# Patient Record
Sex: Male | Born: 1995 | Hispanic: Yes | Marital: Single | Smoking: Never smoker
Health system: Southern US, Community
[De-identification: ages and names within clinical notes are randomized; demographics above are authoritative.]

## PROBLEM LIST (undated history)

## (undated) DIAGNOSIS — I10 Essential (primary) hypertension: Secondary | ICD-10-CM

---
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
## 2015-10-19 ENCOUNTER — Other Ambulatory Visit
Admission: RE | Admit: 2015-10-19 | Discharge: 2015-10-19 | Disposition: A | Discharge status: Home / Self Care | Payer: 59 | Source: Ambulatory Visit | Attending: Pediatric Cardiology | Admitting: Pediatric Cardiology

## 2015-10-19 ENCOUNTER — Other Ambulatory Visit: Payer: Self-pay | Admitting: Pediatric Cardiology

## 2015-10-19 DIAGNOSIS — I10 Essential (primary) hypertension: Secondary | ICD-10-CM

## 2015-10-19 LAB — BASIC METABOLIC PANEL
Anion gap: 7 (ref 5–15)
BUN: 12 mg/dL (ref 6–20)
CALCIUM: 9.5 mg/dL (ref 8.9–10.3)
CO2: 26 mmol/L (ref 22–32)
Chloride: 103 mmol/L (ref 101–111)
Creatinine, Ser: 0.69 mg/dL (ref 0.61–1.24)
Glucose, Bld: 84 mg/dL (ref 65–99)
Potassium: 3.6 mmol/L (ref 3.5–5.1)
Sodium: 136 mmol/L (ref 135–145)

## 2017-02-24 ENCOUNTER — Emergency Department
Admission: EM | Admit: 2017-02-24 | Discharge: 2017-02-24 | Disposition: A | Discharge status: Home / Self Care | Payer: 59 | Attending: Emergency Medicine | Admitting: Emergency Medicine

## 2017-02-24 ENCOUNTER — Encounter: Payer: Self-pay | Admitting: Emergency Medicine

## 2017-02-24 ENCOUNTER — Emergency Department: Payer: 59

## 2017-02-24 DIAGNOSIS — S93402A Sprain of unspecified ligament of left ankle, initial encounter: Secondary | ICD-10-CM

## 2017-02-24 DIAGNOSIS — I10 Essential (primary) hypertension: Secondary | ICD-10-CM | POA: Diagnosis not present

## 2017-02-24 DIAGNOSIS — Y929 Unspecified place or not applicable: Secondary | ICD-10-CM | POA: Insufficient documentation

## 2017-02-24 DIAGNOSIS — W010XXA Fall on same level from slipping, tripping and stumbling without subsequent striking against object, initial encounter: Secondary | ICD-10-CM | POA: Diagnosis not present

## 2017-02-24 DIAGNOSIS — Y999 Unspecified external cause status: Secondary | ICD-10-CM | POA: Insufficient documentation

## 2017-02-24 DIAGNOSIS — Y9302 Activity, running: Secondary | ICD-10-CM | POA: Diagnosis not present

## 2017-02-24 DIAGNOSIS — S93602A Unspecified sprain of left foot, initial encounter: Secondary | ICD-10-CM | POA: Diagnosis not present

## 2017-02-24 DIAGNOSIS — S99922A Unspecified injury of left foot, initial encounter: Secondary | ICD-10-CM | POA: Diagnosis not present

## 2017-02-24 HISTORY — DX: Essential (primary) hypertension: I10

## 2017-02-24 NOTE — ED Triage Notes (Signed)
Reports at club dancing and fell 1 week ago.  Reports pain and swelling to left ankle and foot.  Reports has been working on his feet all week.

## 2017-02-24 NOTE — ED Provider Notes (Signed)
[facility] Emergency Department Provider Note   ____________________________________________   First MD Initiated Contact with Patient 02/24/17 0222     (approximate)  I have reviewed the triage vital signs and the nursing notes.   HISTORY  Chief Complaint Foot Pain    HPI Jamison NeighborJeffrey Lanzo is a 21 y.o. male whocomes into the hospital today with some ankle swelling and pain. The patient states that he sprained his ankle one week ago. He states that he was drunk and her friend was helping him. He was running and he missed a H and landed on his ankle. The patient states that he didn't have any pain. He has worked throughout the week and has been walking on his ankle and foot all day. He's been taking ibuprofen for the pain. His ankle was initially swollen but then his foot became swollen and bruised. The patient states his pain is a 3-4/10 on the pain scale but it's worse when he doesn't take anything. The patient denies any numbness or tingling. He was concerned about a fracture so he decided to come into the hospital today for evaluation.    Past Medical History:  Diagnosis Date  . Hypertension     There are no active problems to display for this patient.   History reviewed. No pertinent surgical history.  Prior to Admission medications   Not on File    Allergies Patient has no known allergies.  No family history on file.  Social History Social History  Substance Use Topics  . Smoking status: Never Smoker  . Smokeless tobacco: Not on file  . Alcohol use Yes    Review of Systems  Constitutional: No fever/chills Eyes: No visual changes. ENT: No sore throat. Cardiovascular: Denies chest pain. Respiratory: Denies shortness of breath. Gastrointestinal: No abdominal pain.  No nausea, no vomiting.  No diarrhea.  No constipation. Genitourinary: Negative for dysuria. Musculoskeletal: Left ankle swelling and pain Skin: Negative for  rash. Neurological: Negative for headaches, focal weakness or numbness.   ____________________________________________   PHYSICAL EXAM:  VITAL SIGNS: ED Triage Vitals  Enc Vitals Group     BP 02/24/17 0105 (!) 159/85     Pulse Rate 02/24/17 0105 94     Resp 02/24/17 0105 20     Temp 02/24/17 0105 98.6 F (37 C)     Temp Source 02/24/17 0105 Oral     SpO2 02/24/17 0105 100 %     Weight 02/24/17 0105 153 lb (69.4 kg)     Height 02/24/17 0105 5\' 8"  (1.727 m)     Head Circumference --      Peak Flow --      Pain Score 02/24/17 0104 0     Pain Loc --      Pain Edu? --      Excl. in GC? --     Constitutional: Alert and oriented. Well appearing and in Mild distress. Eyes: Conjunctivae are normal. PERRL. EOMI. Head: Atraumatic. Nose: No congestion/rhinnorhea. Mouth/Throat: Mucous membranes are moist.  Oropharynx non-erythematous. Cardiovascular: Normal rate, regular rhythm. Grossly normal heart sounds.  Good peripheral circulation. Respiratory: Normal respiratory effort.  No retractions. Lungs CTAB. Gastrointestinal: Soft and nontender. No distention. Positive bowel sounds Musculoskeletal:  Left ankle and foot swelling patient has some bruising to the lateral ankle as well as to the medial posterior ankle. Pain with flexion and eversion of the ankle. Neurologic:  Normal speech and language. No gross focal neurologic deficits are appreciated. No gait instability.  Skin:  Skin is warm, dry and intact. Bruising noted around the ankle and around toes. Psychiatric: Mood and affect are normal.   ____________________________________________   LABS (all labs ordered are listed, but only abnormal results are displayed)  Labs Reviewed - No data to display ____________________________________________  EKG  none ____________________________________________  RADIOLOGY  Dg Ankle Complete Left  Result Date: 02/24/2017 CLINICAL DATA:  Initial evaluation for recent injury, twisted  ankle one week ago. EXAM: LEFT ANKLE COMPLETE - 3+ VIEW COMPARISON:  None. FINDINGS: No acute fracture or dislocation. Ankle mortise approximated. Talar dome intact. Diffuse soft tissue swelling present about the ankle. 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 IMPRESSION: 1. No acute osseous abnormality about the left ankle. 2. Diffuse soft tissue swelling about the ankle. 3. 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 Electronically Signed   By: Rise MuBenjamin  McClintock M.D.   On: 02/24/2017 01:36   Dg Foot Complete Left  Result Date: 02/24/2017 CLINICAL DATA:  Initial evaluation for acute pain, recent injury, twisted ankle. EXAM: LEFT FOOT - COMPLETE 3+ VIEW COMPARISON:  None. FINDINGS: No acute fracture dislocation. Joint spaces well maintained without evidence for significant degenerative or erosive arthropathy. Probable NOF noted within the distal left tibia, better evaluated on concomitant radiograph of the left ankle. No other focal osseous lesions. Mild diffuse soft tissue swelling about the foot. IMPRESSION: 1. No acute osseous abnormality about the left foot. 2. Mild diffuse soft tissue swelling about the foot. Electronically Signed   By: Rise MuBenjamin  McClintock M.D.   On: 02/24/2017 01:38    ____________________________________________   PROCEDURES  Procedure(s) performed: None  Procedures  Critical Care performed: No  ____________________________________________   INITIAL IMPRESSION / ASSESSMENT AND PLAN / ED COURSE  Pertinent labs & imaging results that were available during my care of the patient were reviewed by me and considered in my medical decision making (see chart for details).  This is a 21 year old male who comes into the hospital today with some ankle pain and swelling. The patient injured his ankle  about a week ago but he was still having pain and swelling and bruising so he decided to come in tonight for evaluation. The patient had an x-ray which was unremarkable. The patient does have a sclerotic lesion in his distal tibia but no acute fracture. I encourage the patient to follow-up with orthopedic surgery for further evaluation. I informed him that he may have some ligamentous injury causing his bruising in this pain. The patient will be placed in an ankle splint and he will be discharged.      ____________________________________________   FINAL CLINICAL IMPRESSION(S) / ED DIAGNOSES  Final diagnoses:  Sprain of left ankle, unspecified ligament, initial encounter      NEW MEDICATIONS STARTED DURING THIS VISIT:  There are no discharge medications for this patient.    Note:  This document was prepared using Dragon voice recognition software and may include unintentional dictation errors.    Rebecka ApleyWebster, Lonia Roane P, MD 02/24/17 [phone]

## 2017-02-24 NOTE — Discharge Instructions (Signed)
Please follow up with orthopedic surgery. °

## 2017-12-31 DIAGNOSIS — Z0000 Encounter for general adult medical examination without abnormal findings: Secondary | ICD-10-CM | POA: Diagnosis not present

## 2018-01-14 IMAGING — CR DG FOOT COMPLETE 3+V*L*
3 series · 3 of 3 positions shown · non-contrast
Comparison: None.

CLINICAL DATA: Initial evaluation for acute pain, recent injury,
twisted ankle.

EXAM:
LEFT FOOT - COMPLETE 3+ VIEW

[foot ap]
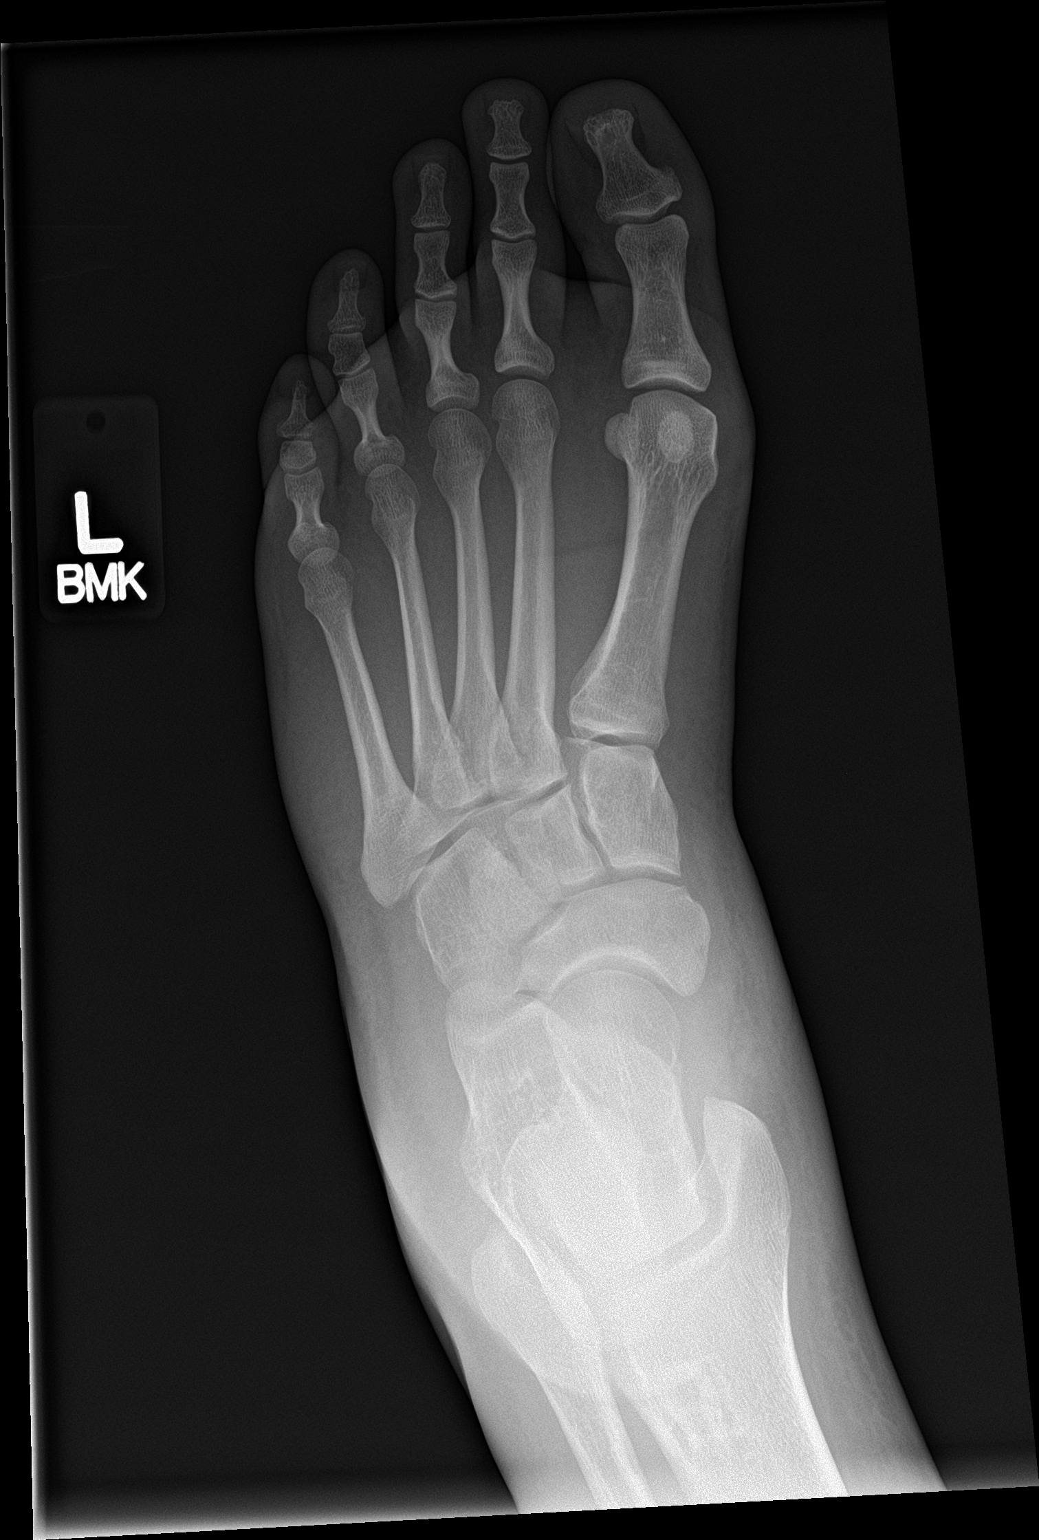

[foot obl]
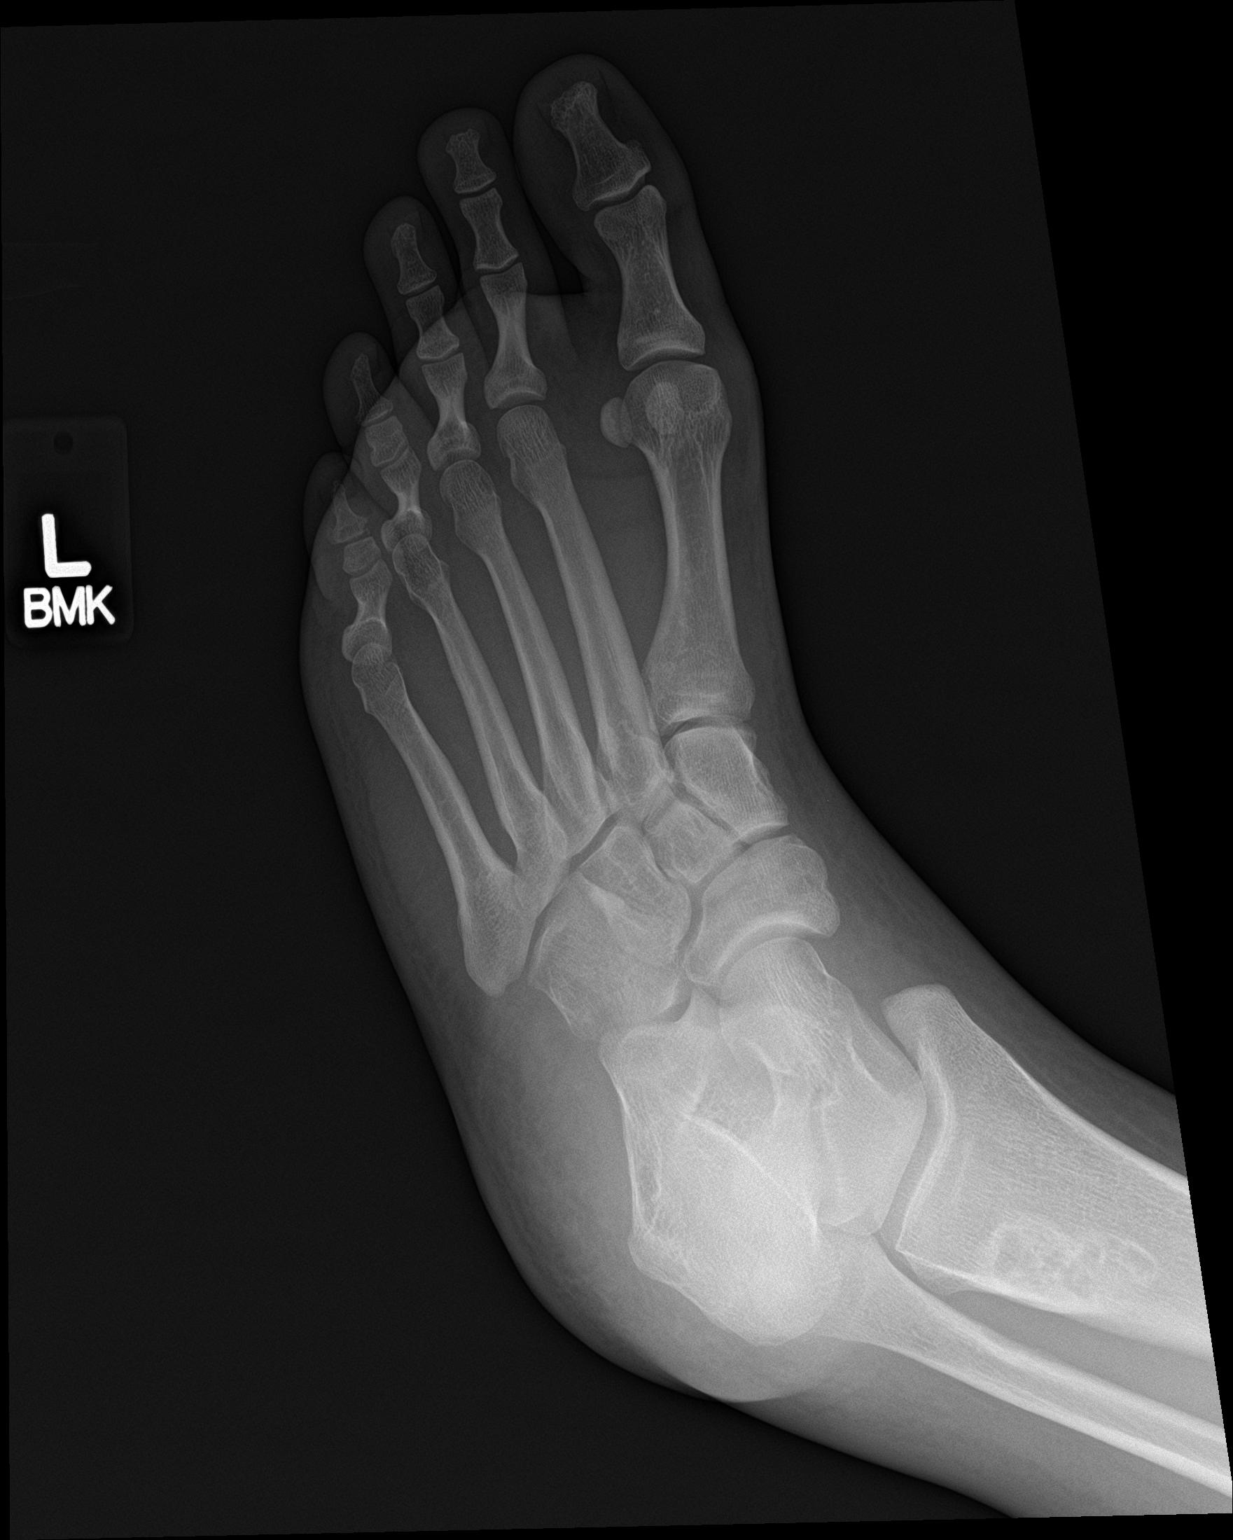

[foot lat]
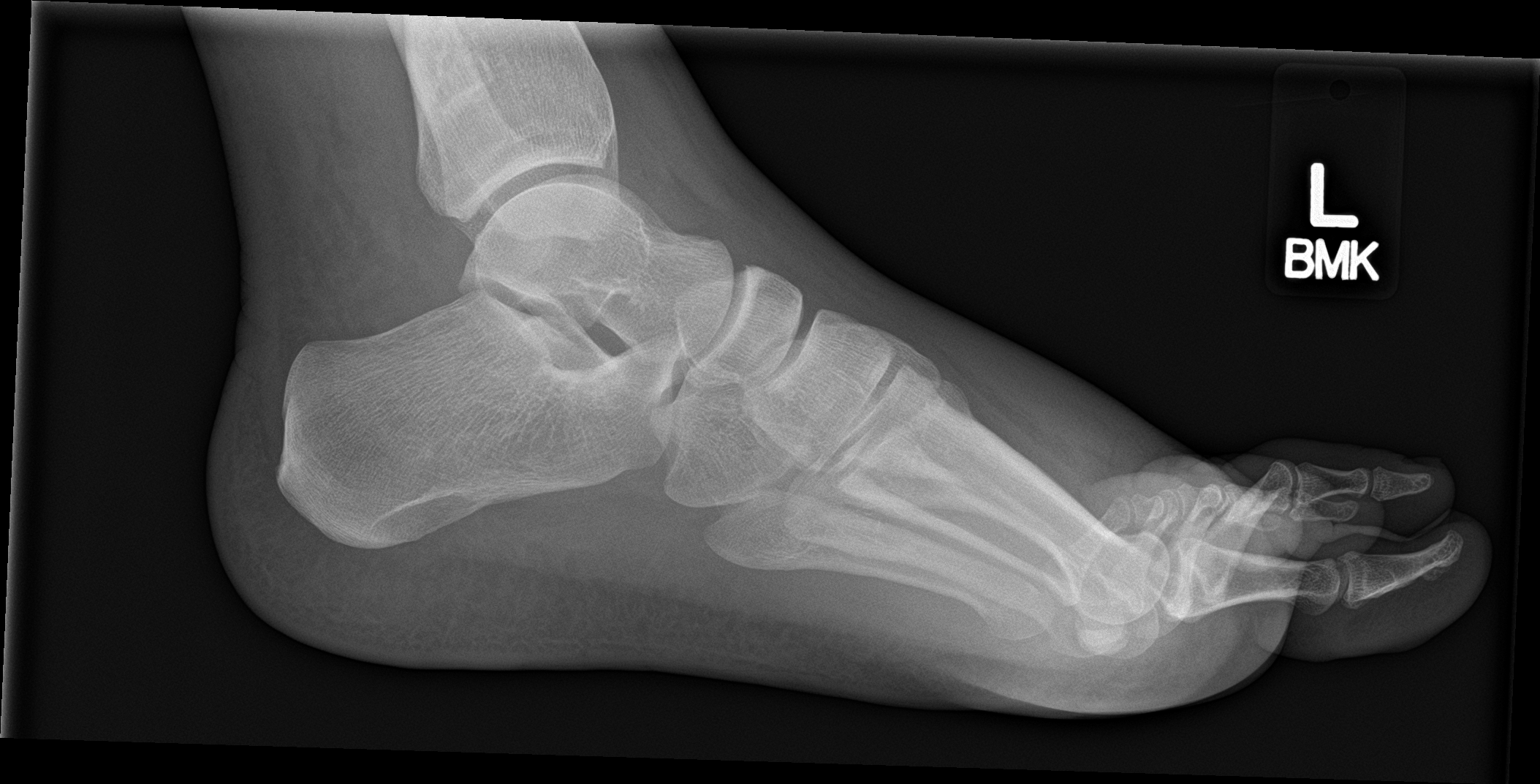

[3 of 3 positions shown; findings below may reference images not displayed]

FINDINGS: No acute fracture dislocation. Joint spaces well maintained without
evidence for significant degenerative or erosive arthropathy.
Probable NOF noted within the distal left tibia, better evaluated on
concomitant radiograph of the left ankle. No other focal osseous
lesions.

Mild diffuse soft tissue swelling about the foot.
IMPRESSION: 1. No acute osseous abnormality about the left foot.
2. Mild diffuse soft tissue swelling about the foot.

## 2020-04-26 ENCOUNTER — Ambulatory Visit: Payer: Self-pay | Attending: Internal Medicine

## 2020-04-26 DIAGNOSIS — Z23 Encounter for immunization: Secondary | ICD-10-CM

## 2020-04-26 NOTE — Progress Notes (Signed)
   Covid-19 Vaccination Clinic  Name:  Jeremiah Fernandez    MRN: [id number] DOB: 24-Jun-1996  04/26/2020  [name] was observed post Covid-19 immunization for 30 minutes based on pre-vaccination screening without incident. He was provided with Vaccine Information Sheet and instruction to access the V-Safe system.   [name] was instructed to call 911 with any severe reactions post vaccine: Marland Kitchen Difficulty breathing  . Swelling of face and throat  . A fast heartbeat  . A bad rash all over body  . Dizziness and weakness   Immunizations Administered    Name Date Dose VIS Date Route   Pfizer COVID-19 Vaccine 04/26/2020  1:40 PM 0.3 mL 09/17/2018 Intramuscular   Manufacturer: [employer], [employer]   Lot: [id number]   NDC: [address]-0

## 2020-05-17 ENCOUNTER — Ambulatory Visit: Payer: Medicaid Other | Attending: Internal Medicine

## 2020-05-17 DIAGNOSIS — Z23 Encounter for immunization: Secondary | ICD-10-CM

## 2020-05-17 NOTE — Progress Notes (Signed)
   Covid-19 Vaccination Clinic  Name:  Jeremiah Fernandez    MRN: [id number] DOB: April 15, 1996  05/17/2020  [name] was observed post Covid-19 immunization for 15 minutes without incident. He was provided with Vaccine Information Sheet and instruction to access the V-Safe system.   [name] was instructed to call 911 with any severe reactions post vaccine: Marland Kitchen Difficulty breathing  . Swelling of face and throat  . A fast heartbeat  . A bad rash all over body  . Dizziness and weakness   Immunizations Administered    Name Date Dose VIS Date Route   Pfizer COVID-19 Vaccine 05/17/2020  9:41 AM 0.3 mL 09/17/2018 Intramuscular   Manufacturer: [employer], [employer]   Lot: [id number]   NDC: [address]-4
